# Patient Record
Sex: Female | Born: 1975 | Hispanic: No | Marital: Married | Smoking: Never smoker
Health system: Southern US, Community
[De-identification: ages and names within clinical notes are randomized; demographics above are authoritative.]

## PROBLEM LIST (undated history)

## (undated) DIAGNOSIS — G629 Polyneuropathy, unspecified: Secondary | ICD-10-CM

## (undated) HISTORY — DX: Polyneuropathy, unspecified: G62.9

---

## 2006-02-19 ENCOUNTER — Other Ambulatory Visit: Admission: RE | Admit: 2006-02-19 | Discharge: 2006-02-19 | Payer: Self-pay | Admitting: Obstetrics and Gynecology

## 2006-04-07 ENCOUNTER — Inpatient Hospital Stay (HOSPITAL_COMMUNITY): Admission: EM | Admit: 2006-04-07 | Discharge: 2006-04-08 | Payer: Self-pay | Admitting: Emergency Medicine

## 2014-01-12 ENCOUNTER — Encounter (HOSPITAL_COMMUNITY): Payer: Self-pay | Admitting: Emergency Medicine

## 2014-01-12 ENCOUNTER — Emergency Department (HOSPITAL_COMMUNITY)
Admission: EM | Admit: 2014-01-12 | Discharge: 2014-01-12 | Disposition: A | Discharge status: Home / Self Care | Payer: BC Managed Care – PPO | Attending: Emergency Medicine | Admitting: Emergency Medicine

## 2014-01-12 DIAGNOSIS — R52 Pain, unspecified: Secondary | ICD-10-CM | POA: Insufficient documentation

## 2014-01-12 DIAGNOSIS — M545 Low back pain, unspecified: Secondary | ICD-10-CM | POA: Insufficient documentation

## 2014-01-12 DIAGNOSIS — R55 Syncope and collapse: Secondary | ICD-10-CM | POA: Insufficient documentation

## 2014-01-12 DIAGNOSIS — Z3202 Encounter for pregnancy test, result negative: Secondary | ICD-10-CM | POA: Insufficient documentation

## 2014-01-12 LAB — I-STAT CHEM 8, ED
BUN: 12 mg/dL (ref 6–23)
CHLORIDE: 103 meq/L (ref 96–112)
Calcium, Ion: 1.14 mmol/L (ref 1.12–1.23)
Creatinine, Ser: 0.8 mg/dL (ref 0.50–1.10)
GLUCOSE: 91 mg/dL (ref 70–99)
HCT: 36 % (ref 36.0–46.0)
Hemoglobin: 12.2 g/dL (ref 12.0–15.0)
POTASSIUM: 4.3 meq/L (ref 3.7–5.3)
Sodium: 141 mEq/L (ref 137–147)
TCO2: 25 mmol/L (ref 0–100)

## 2014-01-12 LAB — POC URINE PREG, ED: Preg Test, Ur: NEGATIVE

## 2014-01-12 MED ORDER — CYCLOBENZAPRINE HCL 10 MG PO TABS
10.0000 mg | ORAL_TABLET | Freq: Once | ORAL | Status: AC
  Administered 2014-01-12: 10 mg via ORAL
  Filled 2014-01-12: qty 1

## 2014-01-12 MED ORDER — SODIUM CHLORIDE 0.9 % IV BOLUS (SEPSIS)
1000.0000 mL | Freq: Once | INTRAVENOUS | Status: AC
  Administered 2014-01-12: 1000 mL via INTRAVENOUS

## 2014-01-12 MED ORDER — KETOROLAC TROMETHAMINE 60 MG/2ML IM SOLN
60.0000 mg | Freq: Once | INTRAMUSCULAR | Status: AC
  Administered 2014-01-12: 60 mg via INTRAMUSCULAR
  Filled 2014-01-12: qty 2

## 2014-01-12 MED ORDER — CYCLOBENZAPRINE HCL 10 MG PO TABS: 10.0000 mg | ORAL_TABLET | Freq: Three times a day (TID) | ORAL | Status: DC | PRN

## 2014-01-12 MED ORDER — KETOROLAC TROMETHAMINE 30 MG/ML IJ SOLN: 30.0000 mg | Freq: Once | INTRAMUSCULAR | Status: DC

## 2014-01-12 MED ORDER — IBUPROFEN 600 MG PO TABS: 600.0000 mg | ORAL_TABLET | Freq: Four times a day (QID) | ORAL | Status: DC | PRN

## 2014-01-12 MED ORDER — HYDROMORPHONE HCL PF 1 MG/ML IJ SOLN
1.0000 mg | Freq: Once | INTRAMUSCULAR | Status: AC
  Administered 2014-01-12: 1 mg via INTRAVENOUS
  Filled 2014-01-12: qty 1

## 2014-01-12 MED ORDER — HYDROCODONE-ACETAMINOPHEN 5-325 MG PO TABS: 1.0000 | ORAL_TABLET | ORAL | Status: DC | PRN

## 2014-01-12 NOTE — ED Notes (Signed)
Pt A&O x4. No c/o pain when lying flat. Reports she cannot sit up without severe pain. VSS. No hx of back problems. On RA. Pt moving all extremities equally. Pt reports having nothing to eat or drink today. Has been caring for young daughter running high temperature. Denies any numbness or swelling. Will continue to monitor.

## 2014-01-12 NOTE — ED Notes (Addendum)
Per EMS- pt has been attending exercise classes for 10 days during the last month. Two weeks ago noticed a back spasm. Bent down today and had severe back spasm. 4 mg zofran and 500 cc NS given by EMS.One patent PIV present on arrival.

## 2014-01-12 NOTE — ED Provider Notes (Signed)
CSN: [id number]     Arrival date & time 01/12/14  50 History   First MD Initiated Contact with Patient 01/12/14 1637     Chief Complaint  Patient presents with  . Back Pain     (Consider location/radiation/quality/duration/timing/severity/associated sxs/prior Treatment) HPI 38 year old female presents with acute back spasm. She states that over the last month she's been doing new exercise classes has been coming home with back pain. The back pain is usually relieved with "a pain spray" her husband. She states that today she bent over and had a much more severe pain than normal. It feels like a bad spasm. Her husband put on some of the spray but it did not help. She has not tried anything else for the pain. The pain is currently severe. If she is laying still it is OK but if she moves at all it hurts. When the pain was really severe she passed out. She states it was due to the pain. Denies fever, dysuria, leg weakness, numbness or B/B incontinence.  History reviewed. No pertinent past medical history. Past Surgical History  Procedure Laterality Date  . Cesarean section     History reviewed. No pertinent family history. History  Substance Use Topics  . Smoking status: Never Smoker   . Smokeless tobacco: Never Used  . Alcohol Use: Yes     Comment: 1 glass of wine per week   OB History   Grav Para Term Preterm Abortions TAB SAB Ect Mult Living                 Review of Systems  Constitutional: Negative for fever.  Respiratory: Negative for shortness of breath.   Gastrointestinal: Negative for vomiting and abdominal pain.  Genitourinary: Negative for dysuria and menstrual problem.  Musculoskeletal: Positive for back pain.  Neurological: Positive for syncope. Negative for weakness and numbness.  All other systems reviewed and are negative.     Allergies  Review of patient's allergies indicates no known allergies.  Home Medications   Prior to Admission medications   Not on  File   BP 105/49  Pulse 72  Temp(Src) 98.5 F (36.9 C) (Oral)  Ht 5' 10"  (1.778 m)  Wt 183 lb (83.008 kg)  BMI 26.26 kg/m2  SpO2 100%  LMP 12/31/2013 Physical Exam  Nursing note and vitals reviewed. Constitutional: She is oriented to person, place, and time. She appears well-developed and well-nourished.  HENT:  Head: Normocephalic and atraumatic.  Right Ear: External ear normal.  Left Ear: External ear normal.  Nose: Nose normal.  Eyes: Right eye exhibits no discharge. Left eye exhibits no discharge.  Cardiovascular: Normal rate, regular rhythm and normal heart sounds.   Pulmonary/Chest: Effort normal and breath sounds normal.  Abdominal: Soft. She exhibits no distension. There is no tenderness.  Musculoskeletal:       Lumbar back: She exhibits decreased range of motion and tenderness. She exhibits no bony tenderness.       Back:  Neurological: She is alert and oriented to person, place, and time. She has normal strength. No sensory deficit. She displays no Babinski's sign on the right side. She displays no Babinski's sign on the left side.  Reflex Scores:      Patellar reflexes are 2+ on the right side and 2+ on the left side.      Achilles reflexes are 2+ on the right side and 2+ on the left side. Normal lower extremity strength bilaterally  Skin: Skin is warm and dry.  ED Course  Procedures (including critical care time) Labs Review Labs Reviewed  I-STAT CHEM 8, ED  POC URINE PREG, ED    Imaging Review No results found.   EKG Interpretation   Date/Time:  Tuesday January 12 2014 17:24:02 EDT Ventricular Rate:  63 PR Interval:  167 QRS Duration: 96 QT Interval:  450 QTC Calculation: 461 R Axis:   80 Text Interpretation:  Normal sinus rhythm Borderline T abnormalities,  anterior leads No old tracing to compare Confirmed by [place]  ([id number]) on 01/12/2014 6:05:29 PM      MDM   Final diagnoses:  Acute low back pain    Patient with acute low  back pain with bending. No bony tenderness or other injury to suggest needing imaging. Neurologically intact, no concern for acute spinal cord issue. Her pain was very severe, likely causing vaso-vagal syncope. EKG shows no acute cardiac reason for syncope. Pain improved with treatment in ED. Able to ambulate. Will treat with pain control and discharge in stable condition.    Ephraim Hamburger, MD 01/13/14 [phone]

## 2014-01-12 NOTE — ED Notes (Signed)
Pt able to stand. Still "feeling groggy". Toradol not given per patient wishes. AVS given, patient aware prescription for Flexeril available.

## 2014-01-12 NOTE — ED Notes (Signed)
Pt is currently attempting to use a bed pan to provide a urine sample.

## 2014-01-12 NOTE — ED Notes (Signed)
Husband here to provide safe transport home, pt unable to stand and bear weight.  Pt reports "this is as bad as before".  Dr. Regenia Skeeter made aware.  Awaiting orders.

## 2014-01-12 NOTE — ED Notes (Signed)
Pt reports pain improved at this time, pt able to stand and bear weight.  Pt assisted to exit via w/c, able to walk few steps to car.  Pt's husband here to provide safe transport home.

## 2014-01-12 NOTE — ED Notes (Signed)
Bed: WA03 Expected date:  Expected time:  Means of arrival:  Comments: EMS- back pain, hypotension

## 2014-01-12 NOTE — ED Notes (Signed)
Jess Barters (husband): [phone] (cell)

## 2014-09-01 ENCOUNTER — Other Ambulatory Visit: Payer: Self-pay

## 2014-09-01 ENCOUNTER — Other Ambulatory Visit (HOSPITAL_COMMUNITY)
Admission: RE | Admit: 2014-09-01 | Discharge: 2014-09-01 | Disposition: A | Discharge status: Home / Self Care | Payer: BC Managed Care – PPO | Source: Ambulatory Visit | Attending: Family Medicine | Admitting: Family Medicine

## 2014-09-01 DIAGNOSIS — Z01419 Encounter for gynecological examination (general) (routine) without abnormal findings: Secondary | ICD-10-CM | POA: Diagnosis present

## 2014-09-06 ENCOUNTER — Other Ambulatory Visit: Payer: Self-pay | Admitting: Family

## 2014-09-06 DIAGNOSIS — R1032 Left lower quadrant pain: Secondary | ICD-10-CM

## 2014-09-06 LAB — CYTOLOGY - PAP

## 2014-09-08 ENCOUNTER — Ambulatory Visit
Admission: RE | Admit: 2014-09-08 | Discharge: 2014-09-08 | Disposition: A | Discharge status: Home / Self Care | Payer: BC Managed Care – PPO | Source: Ambulatory Visit | Attending: Family | Admitting: Family

## 2014-09-08 DIAGNOSIS — R1032 Left lower quadrant pain: Secondary | ICD-10-CM

## 2014-09-08 IMAGING — US US PELVIS COMPLETE
1 series · 14 of 25 positions shown · non-contrast
Comparison: None

CLINICAL DATA: Left lower quadrant pain x2 months during menses



[Series 1: us pelvis complete · 0.31mm/px · 14 of 65 slices shown]
[im 1/65]
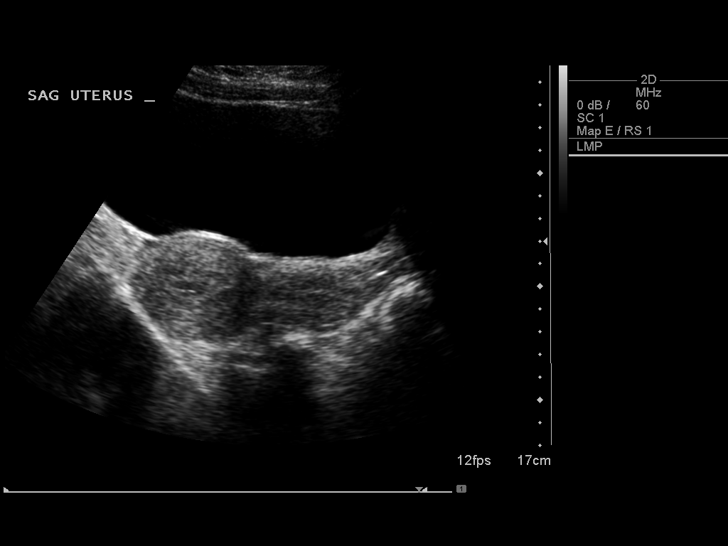
[im 6/65]
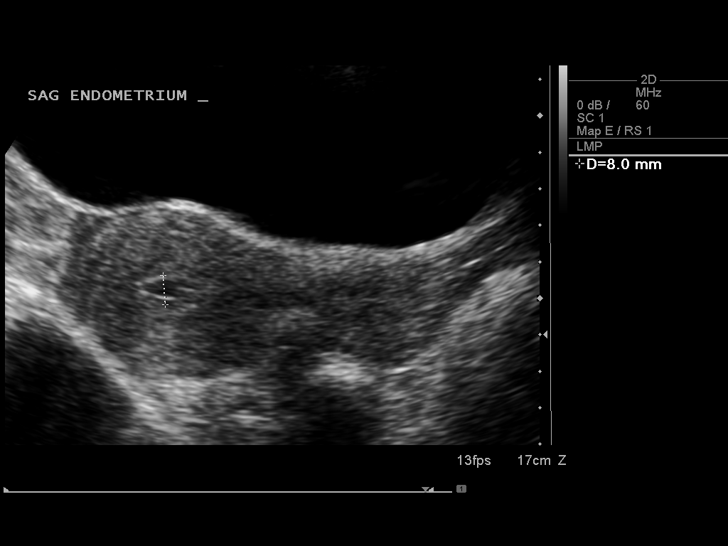
[im 11/65]
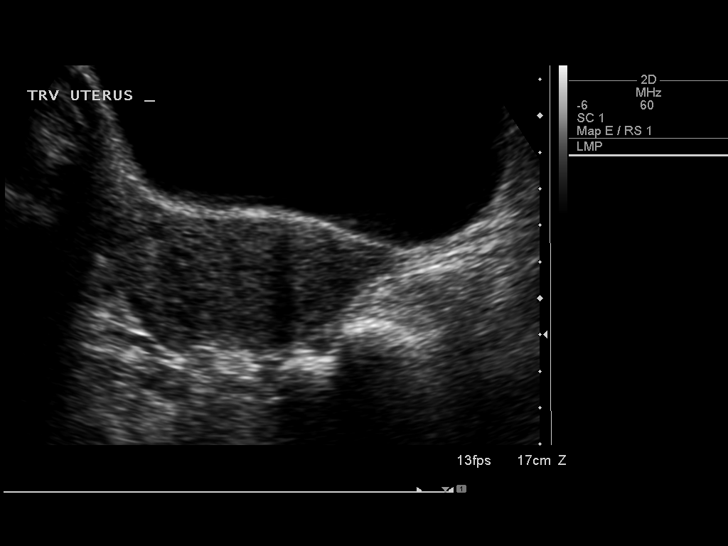
[im 17/65]
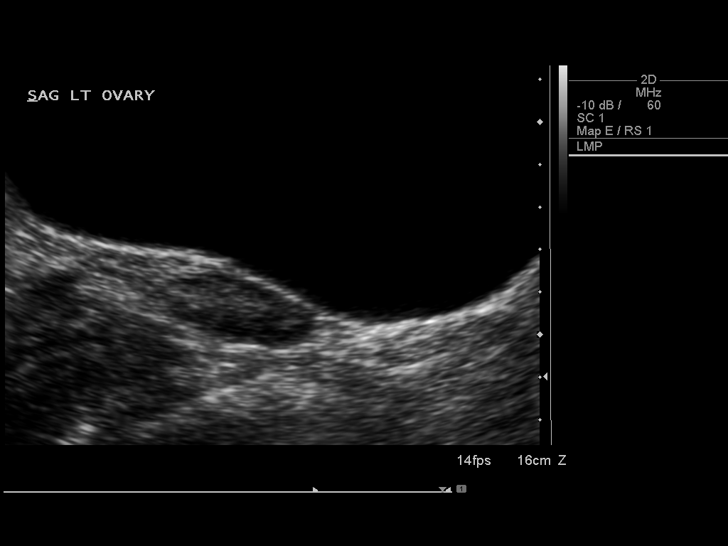
[im 22/65]
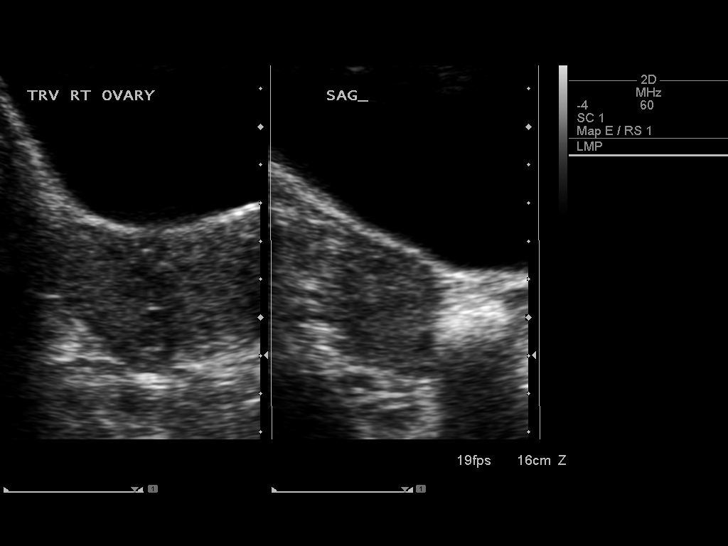
[im 25/65]
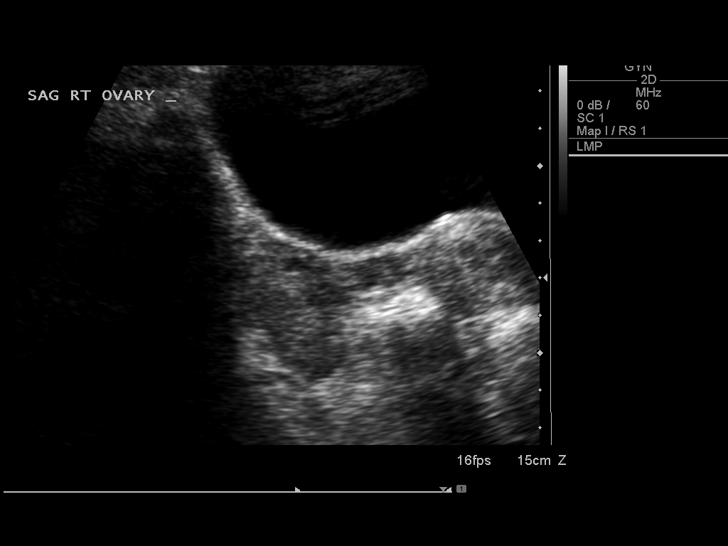
[im 30/65]
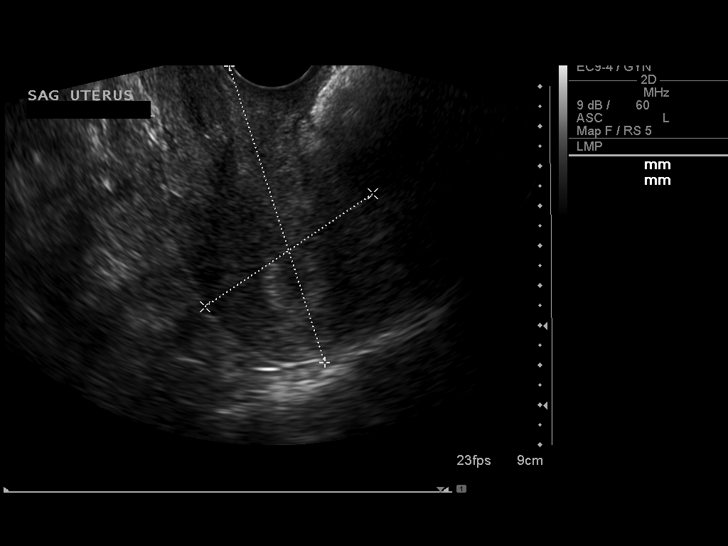
[im 35/65]
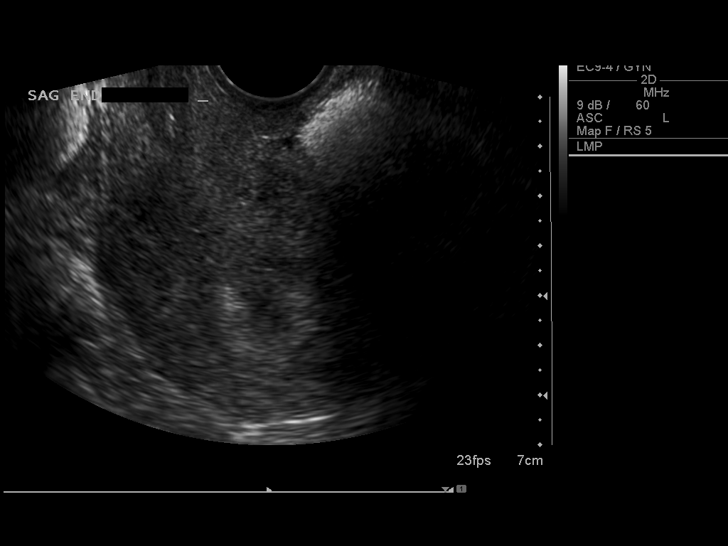
[im 41/65]
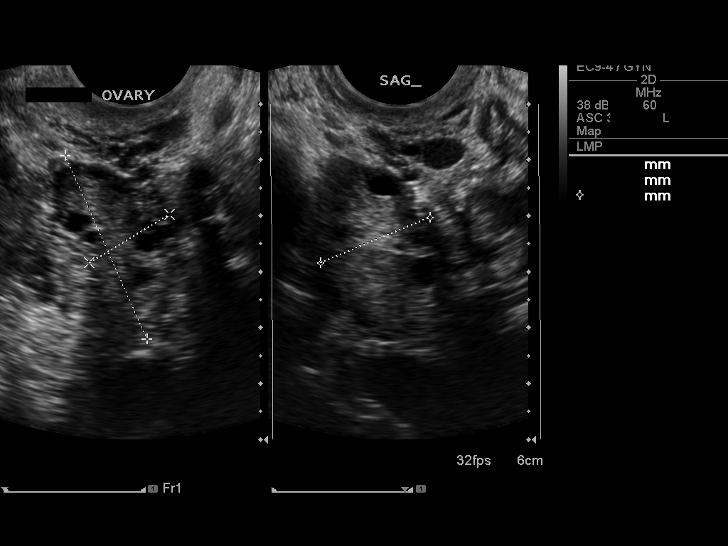
[im 43/65]
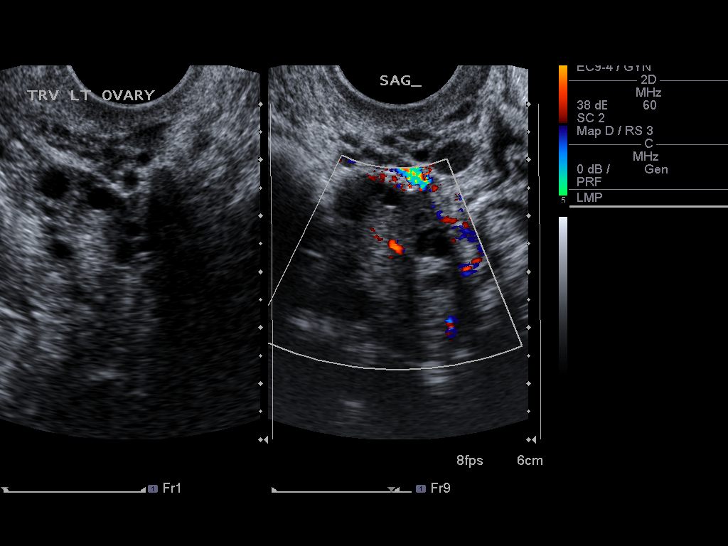
[im 49/65]
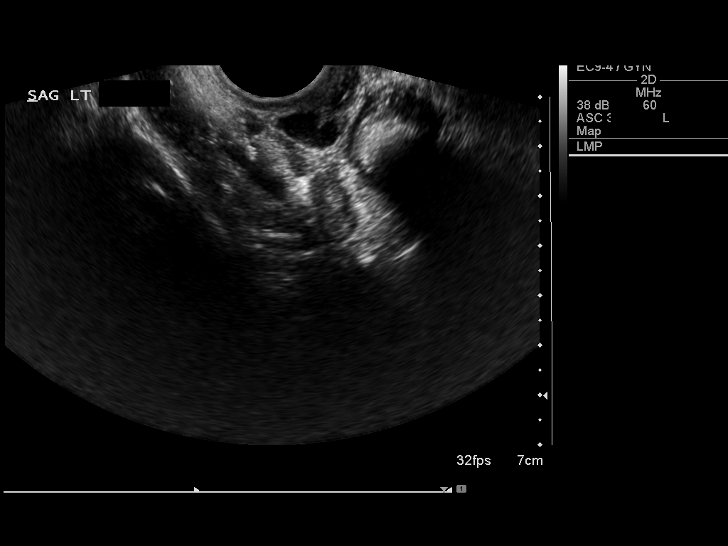
[im 54/65]
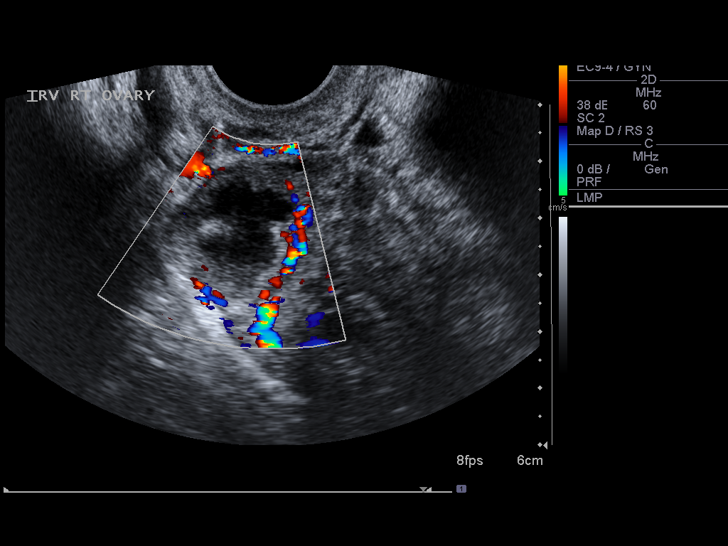
[im 59/65]
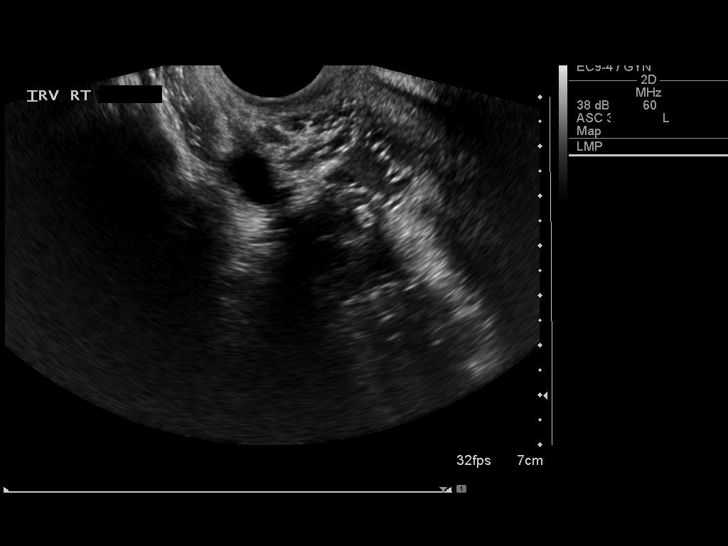
[im 65/65]
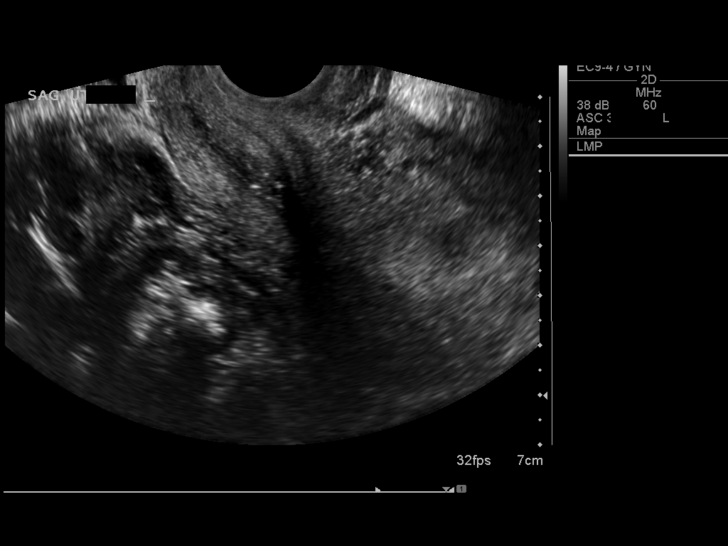

[14 of 25 positions shown; findings below may reference images not displayed]

FINDINGS: Uterus

Measurements: 7.8 x 5.1 x 6.1 cm. Retroflexed. No fibroids or other
mass visualized.

Endometrium

Thickness: 18 mm.  No focal abnormality visualized.

Right ovary

Measurements: 4.2 x 3.1 x 3.0 cm. 1.7 x 2.1 x 1.5 cm complex/
hemorrhagic involuting corpus luteal cyst.

Left ovary

Measurements: 3.6 x 1.7 x 2.1 cm. Normal appearance/no adnexal mass.

Other findings

No free fluid.
IMPRESSION: 2.1 cm complex/hemorrhagic involuting right corpus luteal cyst,
physiologic.

Otherwise negative pelvic ultrasound.

## 2015-07-14 ENCOUNTER — Other Ambulatory Visit: Payer: Self-pay | Admitting: Family Medicine

## 2015-07-14 DIAGNOSIS — R1032 Left lower quadrant pain: Secondary | ICD-10-CM

## 2015-07-14 DIAGNOSIS — Z8742 Personal history of other diseases of the female genital tract: Secondary | ICD-10-CM

## 2015-07-25 ENCOUNTER — Ambulatory Visit
Admission: RE | Admit: 2015-07-25 | Discharge: 2015-07-25 | Disposition: A | Discharge status: Home / Self Care | Payer: BLUE CROSS/BLUE SHIELD | Source: Ambulatory Visit | Attending: Family Medicine | Admitting: Family Medicine

## 2015-07-25 DIAGNOSIS — Z8742 Personal history of other diseases of the female genital tract: Secondary | ICD-10-CM

## 2015-07-25 DIAGNOSIS — R1032 Left lower quadrant pain: Secondary | ICD-10-CM

## 2015-12-07 ENCOUNTER — Encounter (HOSPITAL_COMMUNITY): Payer: Self-pay | Admitting: Family Medicine

## 2015-12-07 ENCOUNTER — Emergency Department (HOSPITAL_BASED_OUTPATIENT_CLINIC_OR_DEPARTMENT_OTHER)
Admit: 2015-12-07 | Discharge: 2015-12-07 | Disposition: A | Discharge status: Home / Self Care | Payer: BLUE CROSS/BLUE SHIELD

## 2015-12-07 ENCOUNTER — Emergency Department (HOSPITAL_COMMUNITY)
Admission: EM | Admit: 2015-12-07 | Discharge: 2015-12-07 | Disposition: A | Discharge status: Home / Self Care | Payer: BLUE CROSS/BLUE SHIELD | Attending: Emergency Medicine | Admitting: Emergency Medicine

## 2015-12-07 DIAGNOSIS — M7989 Other specified soft tissue disorders: Secondary | ICD-10-CM

## 2015-12-07 DIAGNOSIS — Z3202 Encounter for pregnancy test, result negative: Secondary | ICD-10-CM | POA: Insufficient documentation

## 2015-12-07 DIAGNOSIS — N390 Urinary tract infection, site not specified: Secondary | ICD-10-CM | POA: Diagnosis not present

## 2015-12-07 DIAGNOSIS — R202 Paresthesia of skin: Secondary | ICD-10-CM | POA: Insufficient documentation

## 2015-12-07 DIAGNOSIS — R200 Anesthesia of skin: Secondary | ICD-10-CM | POA: Diagnosis present

## 2015-12-07 LAB — I-STAT CHEM 8, ED
BUN: 10 mg/dL (ref 6–20)
CALCIUM ION: 1.2 mmol/L (ref 1.12–1.23)
CHLORIDE: 101 mmol/L (ref 101–111)
Creatinine, Ser: 0.7 mg/dL (ref 0.44–1.00)
GLUCOSE: 117 mg/dL — AB (ref 65–99)
HCT: 40 % (ref 36.0–46.0)
Hemoglobin: 13.6 g/dL (ref 12.0–15.0)
Potassium: 4.2 mmol/L (ref 3.5–5.1)
Sodium: 141 mmol/L (ref 135–145)
TCO2: 26 mmol/L (ref 0–100)

## 2015-12-07 LAB — DIFFERENTIAL
Basophils Absolute: 0 10*3/uL (ref 0.0–0.1)
Basophils Relative: 0 %
EOS PCT: 1 %
Eosinophils Absolute: 0.1 10*3/uL (ref 0.0–0.7)
LYMPHS ABS: 2.4 10*3/uL (ref 0.7–4.0)
LYMPHS PCT: 26 %
MONO ABS: 0.3 10*3/uL (ref 0.1–1.0)
Monocytes Relative: 4 %
NEUTROS ABS: 6.4 10*3/uL (ref 1.7–7.7)
Neutrophils Relative %: 69 %

## 2015-12-07 LAB — COMPREHENSIVE METABOLIC PANEL
ALBUMIN: 3.6 g/dL (ref 3.5–5.0)
ALK PHOS: 37 U/L — AB (ref 38–126)
ALT: 14 U/L (ref 14–54)
ANION GAP: 7 (ref 5–15)
AST: 18 U/L (ref 15–41)
BILIRUBIN TOTAL: 0.3 mg/dL (ref 0.3–1.2)
BUN: 7 mg/dL (ref 6–20)
CALCIUM: 9.4 mg/dL (ref 8.9–10.3)
CO2: 25 mmol/L (ref 22–32)
CREATININE: 0.72 mg/dL (ref 0.44–1.00)
Chloride: 106 mmol/L (ref 101–111)
GFR calc non Af Amer: >60 mL/min (ref >60)
GLUCOSE: 122 mg/dL — AB (ref 65–99)
Potassium: 4.2 mmol/L (ref 3.5–5.1)
SODIUM: 138 mmol/L (ref 135–145)
TOTAL PROTEIN: 6.8 g/dL (ref 6.5–8.1)

## 2015-12-07 LAB — CBC
HCT: 37.5 % (ref 36.0–46.0)
HEMOGLOBIN: 12.4 g/dL (ref 12.0–15.0)
MCH: 28 pg (ref 26.0–34.0)
MCHC: 33.1 g/dL (ref 30.0–36.0)
MCV: 84.7 fL (ref 78.0–100.0)
PLATELETS: 319 10*3/uL (ref 150–400)
RBC: 4.43 MIL/uL (ref 3.87–5.11)
RDW: 13.3 % (ref 11.5–15.5)
WBC: 9.2 10*3/uL (ref 4.0–10.5)

## 2015-12-07 LAB — POC URINE PREG, ED: Preg Test, Ur: NEGATIVE

## 2015-12-07 LAB — URINALYSIS, ROUTINE W REFLEX MICROSCOPIC
BILIRUBIN URINE: NEGATIVE
GLUCOSE, UA: NEGATIVE mg/dL
KETONES UR: NEGATIVE mg/dL
Nitrite: NEGATIVE
PH: 6 (ref 5.0–8.0)
Protein, ur: NEGATIVE mg/dL
SPECIFIC GRAVITY, URINE: 1.013 (ref 1.005–1.030)

## 2015-12-07 LAB — I-STAT TROPONIN, ED: Troponin i, poc: 0 ng/mL (ref 0.00–0.08)

## 2015-12-07 LAB — PROTIME-INR
INR: 1.06 (ref 0.00–1.49)
PROTHROMBIN TIME: 14 s (ref 11.6–15.2)

## 2015-12-07 LAB — URINE MICROSCOPIC-ADD ON

## 2015-12-07 LAB — APTT: aPTT: 31 seconds (ref 24–37)

## 2015-12-07 NOTE — Discharge Instructions (Signed)
Paresthesia Paresthesia is an abnormal burning or prickling sensation. This sensation is generally felt in the hands, arms, legs, or feet. However, it may occur in any part of the body. Usually, it is not painful. The feeling may be described as:  Tingling or numbness.  Pins and needles.  Skin crawling.  Buzzing.  Limbs falling asleep.  Itching. Most people experience temporary (transient) paresthesia at some time in their lives. Paresthesia may occur when you breathe too quickly (hyperventilation). It can also occur without any apparent cause. Commonly, paresthesia occurs when pressure is placed on a nerve. The sensation quickly goes away after the pressure is removed. For some people, however, paresthesia is a long-lasting (chronic) condition that is caused by an underlying disorder. If you continue to have paresthesia, you may need further medical evaluation. HOME CARE INSTRUCTIONS Watch your condition for any changes. Taking the following actions may help to lessen any discomfort that you are feeling:  Avoid drinking alcohol.  Try acupuncture or massage to help relieve your symptoms.  Keep all follow-up visits as directed by your health care provider. This is important. SEEK MEDICAL CARE IF:  You continue to have episodes of paresthesia.  Your burning or prickling feeling gets worse when you walk.  You have pain, cramps, or dizziness.  You develop a rash. SEEK IMMEDIATE MEDICAL CARE IF:  You feel weak.  You have trouble walking or moving.  You have problems with speech, understanding, or vision.  You feel confused.  You cannot control your bladder or bowel movements.  You have numbness after an injury.  You faint.   This information is not intended to replace advice given to you by your health care provider. Make sure you discuss any questions you have with your health care provider.   Document Released: 09/07/2002 Document Revised: 02/01/2015 Document Reviewed:  09/13/2014 Elsevier Interactive Patient Education 2016 Else[employer]  Peripheral Neuropathy Peripheral neuropathy is a type of nerve damage. It affects nerves that carry signals between the spinal cord and other parts of the body. These are called peripheral nerves. With peripheral neuropathy, one nerve or a group of nerves may be damaged.  CAUSES  Many things can damage peripheral nerves. For some people with peripheral neuropathy, the cause is unknown. Some causes include:  Diabetes. This is the most common cause of peripheral neuropathy.  Injury to a nerve.  Pressure or stress on a nerve that lasts a long time.  Too little vitamin B. Alcoholism can lead to this.  Infections.  Autoimmune diseases, such as multiple sclerosis and systemic lupus erythematosus.  Inherited nerve diseases.  Some medicines, such as cancer drugs.  Toxic substances, such as lead and mercury.  Too little blood flowing to the legs.  Kidney disease.  Thyroid disease. SIGNS AND SYMPTOMS  Different people have different symptoms. The symptoms you have will depend on which of your nerves is damaged. Common symptoms include:  Loss of feeling (numbness) in the feet and hands.  Tingling in the feet and hands.  Pain that burns.  Very sensitive skin.  Weakness.  Not being able to move a part of the body (paralysis).  Muscle twitching.  Clumsiness or poor coordination.  Loss of balance.  Not being able to control your bladder.  Feeling dizzy.  Sexual problems. DIAGNOSIS  Peripheral neuropathy is a symptom, not a disease. Finding the cause of peripheral neuropathy can be hard. To figure that out, your health care provider will take a medical history and do a  physical exam. A neurological exam will also be done. This involves checking things affected by your brain, spinal cord, and nerves (nervous system). For example, your health care provider will check your reflexes, how you move, and what  you can feel.  Other types of tests may also be ordered, such as:  Blood tests.  A test of the fluid in your spinal cord.  Imaging tests, such as CT scans or an MRI.  Electromyography (EMG). This test checks the nerves that control muscles.  Nerve conduction velocity tests. These tests check how fast messages pass through your nerves.  Nerve biopsy. A small piece of nerve is removed. It is then checked under a microscope. TREATMENT   Medicine is often used to treat peripheral neuropathy. Medicines may include:  Pain-relieving medicines. Prescription or over-the-counter medicine may be suggested.  Antiseizure medicine. This may be used for pain.  Antidepressants. These also may help ease pain from neuropathy.  Lidocaine. This is a numbing medicine. You might wear a patch or be given a shot.  Mexiletine. This medicine is typically used to help control irregular heart rhythms.  Surgery. Surgery may be needed to relieve pressure on a nerve or to destroy a nerve that is causing pain.  Physical therapy to help movement.  Assistive devices to help movement. HOME CARE INSTRUCTIONS   Only take over-the-counter or prescription medicines as directed by your health care provider. Follow the instructions carefully for any given medicines. Do not take any other medicines without first getting approval from your health care provider.  If you have diabetes, work closely with your health care provider to keep your blood sugar under control.  If you have numbness in your feet:  Check every day for signs of injury or infection. Watch for redness, warmth, and swelling.  Wear padded socks and comfortable shoes. These help protect your feet.  Do not do things that put pressure on your damaged nerve.  Do not smoke. Smoking keeps blood from getting to damaged nerves.  Avoid or limit alcohol. Too much alcohol can cause a lack of B vitamins. These vitamins are needed for healthy  nerves.  Develop a good support system. Coping with peripheral neuropathy can be stressful. Talk to a mental health specialist or join a support group if you are struggling.  Follow up with your health care provider as directed. SEEK MEDICAL CARE IF:   You have new signs or symptoms of peripheral neuropathy.  You are struggling emotionally from dealing with peripheral neuropathy.  You have a fever. SEEK IMMEDIATE MEDICAL CARE IF:   You have an injury or infection that is not healing.  You feel very dizzy or begin vomiting.  You have chest pain.  You have trouble breathing.   This information is not intended to replace advice given to you by your health care provider. Make sure you discuss any questions you have with your health care provider.   Document Released: 09/07/2002 Document Revised: 05/30/2011 Document Reviewed: 05/25/2013 Elsevier Interactive Patient Education [employer].

## 2015-12-07 NOTE — Progress Notes (Signed)
*  Preliminary Results* Bilateral lower extremity venous duplex completed. Bilateral lower extremities are negative for deep vein thrombosis. There is no evidence of Baker's cyst bilaterally.  12/07/2015  Maudry Mayhew, RVT, RDCS, RDMS

## 2015-12-07 NOTE — ED Notes (Signed)
Contacted vascular tech/on the way!

## 2015-12-07 NOTE — ED Provider Notes (Signed)
CSN: [id number]     Arrival date & time 12/07/15  1625 History   First MD Initiated Contact with Patient 12/07/15 1714     Chief Complaint  Patient presents with  . Numbness     (Consider location/radiation/quality/duration/timing/severity/associated sxs/prior Treatment) Patient is a 40 y.o. female presenting with general illness. The history is provided by the patient.  Illness Location:  Bilateral lower extremities Quality:  Feeling of "swelling" Severity:  Moderate Onset quality:  Gradual Duration:  3 days Timing:  Constant Progression:  Worsening Chronicity:  New Context:  First time occurrence. The patient does have a history of chronic back pain who was seen by a chiropractor with back manipulations. Relieved by:  Nothing Worsened by:  Nothing Ineffective treatments:  Nothing Associated symptoms: no abdominal pain, no chest pain, no congestion, no cough, no diarrhea, no fatigue, no fever, no loss of consciousness, no myalgias, no nausea, no rash, no rhinorrhea, no shortness of breath, no vomiting and no wheezing    Patient denied likely heavy metal poisoning (no moonshine consumption). She did report that they had been drinking new type of water for the past 3 weeks but no one in her family had similar symptoms. No recent suspicious fish consumption. No trauma or acute exacerbation of her back pain. Patient also denies any recent fevers illnesses or infections.   Of note the patient was seen by her PCP who recommended she present to the ED for further evaluation of possible Guillain-Barr. Additionally patient was noted to have a UTI at her PCP but was not given antibiotics in case she would be admitted here.   History reviewed. No pertinent past medical history. Past Surgical History  Procedure Laterality Date  . Cesarean section     History reviewed. No pertinent family history. Social History  Substance Use Topics  . Smoking status: Never Smoker   . Smokeless tobacco:  Never Used  . Alcohol Use: Yes     Comment: 1 glass of wine per week   OB History    No data available     Review of Systems  Constitutional: Negative for fever and fatigue.  HENT: Negative for congestion and rhinorrhea.   Respiratory: Negative for cough, shortness of breath and wheezing.   Cardiovascular: Negative for chest pain.  Gastrointestinal: Negative for nausea, vomiting, abdominal pain and diarrhea.  Musculoskeletal: Negative for myalgias.  Skin: Negative for rash.  Neurological: Negative for dizziness, loss of consciousness, syncope, facial asymmetry, weakness and light-headedness.       No urinary or bowel incontinence      Allergies  Review of patient's allergies indicates no known allergies.  Home Medications   Prior to Admission medications   Medication Sig Start Date End Date Taking? Authorizing Provider  cyclobenzaprine (FLEXERIL) 10 MG tablet Take 1 tablet (10 mg total) by mouth 3 (three) times daily as needed for muscle spasms. 01/12/14   Sherwood Gambler, MD  HYDROcodone-acetaminophen (NORCO) 5-325 MG per tablet Take 1-2 tablets by mouth every 4 (four) hours as needed. 01/12/14   Sherwood Gambler, MD  ibuprofen (ADVIL,MOTRIN) 600 MG tablet Take 1 tablet (600 mg total) by mouth every 6 (six) hours as needed for mild pain or moderate pain. 01/12/14   Sherwood Gambler, MD   BP 114/72 mmHg  Pulse 77  Temp(Src) 98.1 F (36.7 C) (Oral)  Resp 16  SpO2 96%  LMP 12/02/2015 Physical Exam  Constitutional: She is oriented to person, place, and time. She appears well-developed and well-nourished. No distress.  HENT:  Head: Normocephalic and atraumatic.  Right Ear: External ear normal.  Left Ear: External ear normal.  Nose: Nose normal.  Eyes: Conjunctivae and EOM are normal. Pupils are equal, round, and reactive to light. Right eye exhibits no discharge. Left eye exhibits no discharge. No scleral icterus.  Neck: Normal range of motion. Neck supple.  Cardiovascular:  Normal rate, regular rhythm and normal heart sounds.  Exam reveals no gallop and no friction rub.   No murmur heard. Pulmonary/Chest: Effort normal and breath sounds normal. No stridor. No respiratory distress. She has no wheezes.  Abdominal: Soft. She exhibits no distension. There is no tenderness.  Musculoskeletal: She exhibits no edema or tenderness.  Neurological: She is alert and oriented to person, place, and time.  Cranial Nerves  II Visual Fields: Intact to confrontation. Visual fields intact. III, IV, VI: Pupils equal and reactive to light and near. Full eye movement without nystagmus  V Facial Sensation: Normal. No weakness of masticatory muscles  VII: No facial weakness or asymmetry  VIII Auditory Acuity: Grossly normal  IX/X: The uvula is midline; the palate elevates symmetrically  XI: Normal sternocleidomastoid and trapezius strength  XII: The tongue is midline. No atrophy or fasciculations.   Motor System: Muscle Strength: 5/5 and symmetric in the upper and lower extremities. No pronation or drift.  Muscle Tone: Tone and muscle bulk are normal in the upper and lower extremities.   Reflexes: DTRs: 2+ and symmetrical in all four extremities. Plantar responses are flexor bilaterally.  Coordination: Intact finger-to-nose, heel-to-shin, and rapid alternating movements. No tremor.  Sensation: Intact to light touch, vibration, and pinprick. Normal saddle sensation. Negative Romberg test.  Gait: Routine and tandem gait are normal   Skin: Skin is warm and dry. No rash noted. She is not diaphoretic. No erythema.  Psychiatric: She has a normal mood and affect.    ED Course  Procedures (including critical care time) Labs Review Labs Reviewed  COMPREHENSIVE METABOLIC PANEL - Abnormal; Notable for the following:    Glucose, Bld 122 (*)    Alkaline Phosphatase 37 (*)    All other components within normal limits  URINALYSIS, ROUTINE W REFLEX MICROSCOPIC (NOT AT [facility]) - Abnormal;  Notable for the following:    APPearance CLOUDY (*)    Hgb urine dipstick MODERATE (*)    Leukocytes, UA MODERATE (*)    All other components within normal limits  URINE MICROSCOPIC-ADD ON - Abnormal; Notable for the following:    Squamous Epithelial / LPF 0-5 (*)    Bacteria, UA FEW (*)    All other components within normal limits  I-STAT CHEM 8, ED - Abnormal; Notable for the following:    Glucose, Bld 117 (*)    All other components within normal limits  URINE CULTURE  PROTIME-INR  APTT  CBC  DIFFERENTIAL  I-STAT TROPOININ, ED  POC URINE PREG, ED  CBG MONITORING, ED    Imaging Review No results found. I have personally reviewed and evaluated these images and lab results as part of my medical decision-making.   EKG Interpretation None      MDM   1. Urinary tract infection. We'll repeat a UA to confirm the infection and treat if needed. Urinalysis indeterminate for UTI. Culture sent. At this time will hold off on treatment until cultures result.  2. Paresthesias.  Differential: electrolyte derangements, vitamin deficiency, or DVT. Unlikely heavy metal poisoning, food toxin, Guillain-Barr, transverse myelitis, MS, CVA, or cauda equina. We'll also rule out pregnancy.  UPT negative. Labs without electrolyte arrangements. CBC unremarkable (No evidence of macrocytic anemia suggestive and vitamin B12 deficiency). Ultrasound negative for DVTs.  She is safe for discharge with strict return precautions. Patient is to follow-up with her PCP in next 1-2 days for continued workup and management.  Diagnostic studies interpreted by me and use to my clinical decision-making.   Patient seen in conjunction with Dr. Sabra Heck.   Final diagnoses:  Paresthesia        Addison Lank, MD 12/07/15 1939  Noemi Chapel, MD 12/09/15 2136

## 2015-12-07 NOTE — ED Notes (Signed)
Pt here for numbness in bilateral extremities that started Saturday and now is ascending into pelvis area. sts some leg and knee pain.

## 2015-12-09 LAB — URINE CULTURE

## 2015-12-20 ENCOUNTER — Other Ambulatory Visit (HOSPITAL_COMMUNITY): Payer: Self-pay | Admitting: Family Medicine

## 2015-12-20 DIAGNOSIS — R7989 Other specified abnormal findings of blood chemistry: Secondary | ICD-10-CM

## 2016-01-03 ENCOUNTER — Ambulatory Visit (HOSPITAL_COMMUNITY)
Admission: RE | Admit: 2016-01-03 | Discharge: 2016-01-03 | Disposition: A | Discharge status: Home / Self Care | Payer: BLUE CROSS/BLUE SHIELD | Source: Ambulatory Visit | Attending: Family Medicine | Admitting: Family Medicine

## 2016-01-03 ENCOUNTER — Encounter (HOSPITAL_COMMUNITY): Payer: BLUE CROSS/BLUE SHIELD

## 2016-01-03 DIAGNOSIS — R946 Abnormal results of thyroid function studies: Secondary | ICD-10-CM | POA: Insufficient documentation

## 2016-01-03 DIAGNOSIS — R7989 Other specified abnormal findings of blood chemistry: Secondary | ICD-10-CM

## 2016-01-04 ENCOUNTER — Encounter (HOSPITAL_COMMUNITY)
Admission: RE | Admit: 2016-01-04 | Discharge: 2016-01-04 | Disposition: A | Discharge status: Home / Self Care | Payer: BLUE CROSS/BLUE SHIELD | Source: Ambulatory Visit | Attending: Family Medicine | Admitting: Family Medicine

## 2016-01-04 ENCOUNTER — Encounter (HOSPITAL_COMMUNITY): Payer: BLUE CROSS/BLUE SHIELD

## 2016-01-04 DIAGNOSIS — R946 Abnormal results of thyroid function studies: Secondary | ICD-10-CM | POA: Diagnosis not present

## 2016-01-04 MED ORDER — SODIUM PERTECHNETATE TC 99M INJECTION: 10.0000 | Freq: Once | INTRAVENOUS | Status: DC | PRN

## 2016-01-04 MED ORDER — SODIUM IODIDE I 131 CAPSULE: 0.0080 | Freq: Once | INTRAVENOUS | Status: DC | PRN

## 2016-01-17 ENCOUNTER — Encounter (HOSPITAL_COMMUNITY): Payer: BLUE CROSS/BLUE SHIELD

## 2016-01-18 ENCOUNTER — Other Ambulatory Visit (HOSPITAL_COMMUNITY): Payer: BLUE CROSS/BLUE SHIELD

## 2016-01-23 ENCOUNTER — Encounter: Payer: Self-pay | Admitting: Neurology

## 2016-01-23 ENCOUNTER — Ambulatory Visit (INDEPENDENT_AMBULATORY_CARE_PROVIDER_SITE_OTHER): Payer: BLUE CROSS/BLUE SHIELD | Admitting: Neurology

## 2016-01-23 VITALS — BP 116/76 | HR 79 | Ht 70.0 in | Wt 181.0 lb

## 2016-01-23 DIAGNOSIS — M2550 Pain in unspecified joint: Secondary | ICD-10-CM | POA: Diagnosis not present

## 2016-01-23 DIAGNOSIS — R202 Paresthesia of skin: Secondary | ICD-10-CM | POA: Diagnosis not present

## 2016-01-23 MED ORDER — ALPRAZOLAM 0.5 MG PO TABS: 0.5000 mg | ORAL_TABLET | Freq: Every evening | ORAL | Status: DC | PRN

## 2016-01-23 MED ORDER — DULOXETINE HCL 30 MG PO CPEP: 30.0000 mg | ORAL_CAPSULE | Freq: Every day | ORAL | Status: AC

## 2016-01-23 NOTE — Progress Notes (Signed)
PATIENT: Holly Serrano DOB: Apr 24, 1976  Chief Complaint  Patient presents with  . Numbness    She started experiencing numbness in her toes in January then shortly after in her bilateral legs.  Now she is reporting numbness and pain in various areas of her body.  She has also developed exteme fatigue.  Intermittently, she wakes up with chest tightness and has to take deep breaths to work through the episodes.     HISTORICAL  Holly Serrano is a 41 years old right-handed female, seen in refer by her primary care physician Dr. Aura Dials for evaluation of paresthesia In January 23 2016  I reviewed and summarized referring note, she had a history of anemia, is taking iron supplement,  She used to be very active, since January 2017, she began to have constellation of symptoms, feel fatigue, Shortness of breath with prolonged walking, her toes tends to change into blue color, she also complains of bilateral foot numbness, intermittent leg pain, numbness, foggy sensation, difficulty focusing, also has variable degree of body achy pain,  Symptoms getting worse since April 2017, she felt bloated in her abdomen, chest pressure, tightness sensation, intermittent neck, hip, joint pain, frequent headaches starting at occipital region, spreading forward, retro-orbital area pressure headaches, like to close her eyes. She also complains of constellation of complaints, including decreased energy, extreme fatigue, slurred speech, confusion,   I reviewed laboratory evaluation in December 13 2015 showed normal CBC, with hemoglobin of 12.9, normal CMP, B12 was 1 980, TSH was elevated 5.98, normal free T4, heavy metal profile was negative  Repeat laboratory showed elevated thyroglobulin antibody, 87.4, normal being less than 0.9, elevated TSH, 5.98, normal free T4, free T3, she will have evaluation by her endocrinologist soon.   Methylmalonic acid level was 200, heavy metal profile was negative for lead,  arsenic, mercury,  Thyroid uptake January 04 2016 showed normal 24-hour radioactive thyroid uptake and scan  REVIEW OF SYSTEMS: Full 14 system review of systems performed and notable only for weight gain, fatigue, eye pain, shortness of breath, constipation, blood in urine, increased thirst, achy muscles, confusion, headaches, numbness, weakness, slurred speech, dizziness, insomnia, restless leg, anxiety, decreased energy.  ALLERGIES: No Known Allergies  HOME MEDICATIONS: Current Outpatient Prescriptions  Medication Sig Dispense Refill  . IRON PO Take by mouth daily.    Marland Kitchen MICROGESTIN 1-20 MG-MCG tablet   0   No current facility-administered medications for this visit.    PAST MEDICAL HISTORY: Past Medical History  Diagnosis Date  . Peripheral neuropathy ([place])     PAST SURGICAL HISTORY: Past Surgical History  Procedure Laterality Date  . Cesarean section      x 2    FAMILY HISTORY: Family History  Problem Relation Age of Onset  . Brain cancer Father   . Healthy Mother     SOCIAL HISTORY:  Social History   Social History  . Marital Status: Married    Spouse Name: N/A  . Number of Children: 2  . Years of Education: Masters   Occupational History  . Housewife    Social History Main Topics  . Smoking status: Never Smoker   . Smokeless tobacco: Never Used  . Alcohol Use: No  . Drug Use: No  . Sexual Activity: Yes   Other Topics Concern  . Not on file   Social History Narrative   Lives at home with husband and two children.   Right-handed.   2 cups tea per day.  PHYSICAL EXAM   Filed Vitals:   01/23/16 1330  BP: 116/76  Pulse: 79  Height: 5' 10"  (1.778 m)  Weight: 181 lb (82.101 kg)    Not recorded      Body mass index is 25.97 kg/(m^2).  PHYSICAL EXAMNIATION:  Gen: NAD, conversant, well nourised, obese, well groomed                     Cardiovascular: Regular rate rhythm, no peripheral edema, warm, nontender. Eyes: Conjunctivae clear  without exudates or hemorrhage Neck: Supple, no carotid bruise. Pulmonary: Clear to auscultation bilaterally   NEUROLOGICAL EXAM:  MENTAL STATUS: Speech:    Speech is normal; fluent and spontaneous with normal comprehension.  Cognition:     Orientation to time, place and person     Normal recent and remote memory     Normal Attention span and concentration     Normal Language, naming, repeating,spontaneous speech     Fund of knowledge   CRANIAL NERVES: CN II: Visual fields are full to confrontation. Fundoscopic exam is normal with sharp discs and no vascular changes. Pupils are round equal and briskly reactive to light. CN III, IV, VI: extraocular movement are normal. No ptosis. CN V: Facial sensation is intact to pinprick in all 3 divisions bilaterally. Corneal responses are intact.  CN VII: Face is symmetric with normal eye closure and smile. CN VIII: Hearing is normal to rubbing fingers CN IX, X: Palate elevates symmetrically. Phonation is normal. CN XI: Head turning and shoulder shrug are intact CN XII: Tongue is midline with normal movements and no atrophy.  MOTOR: There is no pronator drift of out-stretched arms. Muscle bulk and tone are normal. Muscle strength is normal.  REFLEXES: Reflexes are 2+ and symmetric at the biceps, triceps, knees, and ankles. Plantar responses are flexor.  SENSORY: Intact to light touch, pinprick, positional sensation and vibratory sensation are intact in fingers and toes.  COORDINATION: Rapid alternating movements and fine finger movements are intact. There is no dysmetria on finger-to-nose and heel-knee-shin.    GAIT/STANCE: Posture is normal. Gait is steady with normal steps, base, arm swing, and turning. Heel and toe walking are normal. Tandem gait is normal.  Romberg is absent.   DIAGNOSTIC DATA (LABS, IMAGING, TESTING) - I reviewed patient records, labs, notes, testing and imaging myself where available.   ASSESSMENT AND  PLAN  Holly Serrano is a 40 y.o. female   Bilateral lower extremity paresthesia,  Need to rule out central nervous system structural lesions, proceed with MRI of brain, MRI of cervical spine  Cymbalta 30 mg, titrating to 60 mg daily    Marcial Pacas, M.D. Ph.D.  [facility] [address], [place],  [address] Ph: [phone] Fax: [phone]  CC: Aura Dials, MD

## 2016-02-01 DIAGNOSIS — R202 Paresthesia of skin: Secondary | ICD-10-CM | POA: Diagnosis not present

## 2016-02-02 ENCOUNTER — Ambulatory Visit (INDEPENDENT_AMBULATORY_CARE_PROVIDER_SITE_OTHER): Payer: Self-pay

## 2016-02-02 ENCOUNTER — Ambulatory Visit (INDEPENDENT_AMBULATORY_CARE_PROVIDER_SITE_OTHER): Payer: BLUE CROSS/BLUE SHIELD

## 2016-02-02 DIAGNOSIS — Z0289 Encounter for other administrative examinations: Secondary | ICD-10-CM

## 2016-02-02 DIAGNOSIS — R202 Paresthesia of skin: Secondary | ICD-10-CM

## 2016-02-02 DIAGNOSIS — M2550 Pain in unspecified joint: Secondary | ICD-10-CM

## 2016-02-23 ENCOUNTER — Encounter: Payer: Self-pay | Admitting: Neurology

## 2016-02-23 ENCOUNTER — Ambulatory Visit (INDEPENDENT_AMBULATORY_CARE_PROVIDER_SITE_OTHER): Payer: BLUE CROSS/BLUE SHIELD | Admitting: Neurology

## 2016-02-23 VITALS — BP 107/77 | HR 71 | Resp 16 | Ht 70.0 in | Wt 189.0 lb

## 2016-02-23 DIAGNOSIS — R202 Paresthesia of skin: Secondary | ICD-10-CM | POA: Diagnosis not present

## 2016-02-23 DIAGNOSIS — M2550 Pain in unspecified joint: Secondary | ICD-10-CM | POA: Diagnosis not present

## 2016-02-23 NOTE — Progress Notes (Signed)
Chief Complaint  Patient presents with  . Follow-up    Rm 4. F/U. Patient reports that since last visit she has had some weight gain and joint pain especially in the knees. Also here to discuss MRI.       PATIENT: Holly Serrano DOB: [birth year]  Chief Complaint  Patient presents with  . Follow-up    Rm 4. F/U. Patient reports that since last visit she has had some weight gain and joint pain especially in the knees. Also here to discuss MRI.      HISTORICAL  Holly Serrano is a 40 years old right-handed female, seen in refer by her primary care physician Dr. Aura Dials for evaluation of paresthesia In January 23 2016  I reviewed and summarized referring note, she had a history of anemia, is taking iron supplement,  She used to be very active, since January 2017, she began to have constellation of symptoms, feel fatigue, Shortness of breath with prolonged walking, her toes tends to change into blue color, she also complains of bilateral foot numbness, intermittent leg pain, numbness, foggy sensation, difficulty focusing, also has variable degree of body achy pain,  Symptoms getting worse since April 2017, she felt bloated in her abdomen, chest pressure, tightness sensation, intermittent neck, hip, joint pain, frequent headaches starting at occipital region, spreading forward, retro-orbital area pressure headaches, like to close her eyes. She also complains of constellation of complaints, including decreased energy, extreme fatigue, slurred speech, confusion,   I reviewed laboratory evaluation in December 13 2015 showed normal CBC, with hemoglobin of 12.9, normal CMP, B12 was 1 980, TSH was elevated 5.98, normal free T4, heavy metal profile was negative  Repeat laboratory showed elevated thyroglobulin antibody, 87.4, normal being less than 0.9, elevated TSH, 5.98, normal free T4, free T3, she will have evaluation by her endocrinologist soon.   Methylmalonic acid level was 200, heavy  metal profile was negative for lead, arsenic, mercury,  Thyroid uptake January 04 2016 showed normal 24-hour radioactive thyroid uptake and scan  UPDATE May 25th 2017: We have personally reviewed MRI of the brain in April 2017 that was normal, MRI of the cervical spine mild degenerative disc disease no significant foraminal canal stenosis  She continue to have intermittent muscle achiness, stiffness, bloating sensation, weight gain, she was recently diagnosed with low vitamin B12, has received intramuscular supplement, now on sublingual B12  REVIEW OF SYSTEMS: Full 14 system review of systems performed and notable only for weight gain, fatigue, frequent urination, achy muscles, joints pain, neck stiffness, headaches, murmur ALLERGIES: No Known Allergies  HOME MEDICATIONS: Current Outpatient Prescriptions  Medication Sig Dispense Refill  . DULoxetine (CYMBALTA) 30 MG capsule Take 1 capsule (30 mg total) by mouth daily. 30 capsule 11  . IRON PO Take by mouth daily.    Marland Kitchen MICROGESTIN 1-20 MG-MCG tablet   0   No current facility-administered medications for this visit.    PAST MEDICAL HISTORY: Past Medical History  Diagnosis Date  . Peripheral neuropathy ([place])     PAST SURGICAL HISTORY: Past Surgical History  Procedure Laterality Date  . Cesarean section      x 2    FAMILY HISTORY: Family History  Problem Relation Age of Onset  . Brain cancer Father   . Healthy Mother     SOCIAL HISTORY:  Social History   Social History  . Marital Status: Married    Spouse Name: N/A  . Number of Children: 2  . Years of Education: Masters  Occupational History  . Housewife    Social History Main Topics  . Smoking status: Never Smoker   . Smokeless tobacco: Never Used  . Alcohol Use: No  . Drug Use: No  . Sexual Activity: Yes   Other Topics Concern  . Not on file   Social History Narrative   Lives at home with husband and two children.   Right-handed.   2 cups tea per day.       PHYSICAL EXAM   Filed Vitals:   02/23/16 1216  BP: 107/77  Pulse: 71  Resp: 16  Height: 5' 10"  (1.778 m)  Weight: 189 lb (85.73 kg)    Not recorded      Body mass index is 27.12 kg/(m^2).  PHYSICAL EXAMNIATION:  Gen: NAD, conversant, well nourised, obese, well groomed                     Cardiovascular: Regular rate rhythm, no peripheral edema, warm, nontender. Eyes: Conjunctivae clear without exudates or hemorrhage Neck: Supple, no carotid bruise. Pulmonary: Clear to auscultation bilaterally   NEUROLOGICAL EXAM:  MENTAL STATUS: Speech:    Speech is normal; fluent and spontaneous with normal comprehension.  Cognition:     Orientation to time, place and person     Normal recent and remote memory     Normal Attention span and concentration     Normal Language, naming, repeating,spontaneous speech     Fund of knowledge   CRANIAL NERVES: CN II: Visual fields are full to confrontation. Fundoscopic exam is normal with sharp discs and no vascular changes. Pupils are round equal and briskly reactive to light. CN III, IV, VI: extraocular movement are normal. No ptosis. CN V: Facial sensation is intact to pinprick in all 3 divisions bilaterally. Corneal responses are intact.  CN VII: Face is symmetric with normal eye closure and smile. CN VIII: Hearing is normal to rubbing fingers CN IX, X: Palate elevates symmetrically. Phonation is normal. CN XI: Head turning and shoulder shrug are intact CN XII: Tongue is midline with normal movements and no atrophy.  MOTOR: There is no pronator drift of out-stretched arms. Muscle bulk and tone are normal. Muscle strength is normal.  REFLEXES: Reflexes are 2+ and symmetric at the biceps, triceps, knees, and ankles. Plantar responses are flexor.  SENSORY: Intact to light touch, pinprick, positional sensation and vibratory sensation are intact in fingers and toes.  COORDINATION: Rapid alternating movements and fine finger  movements are intact. There is no dysmetria on finger-to-nose and heel-knee-shin.    GAIT/STANCE: Posture is normal. Gait is steady with normal steps, base, arm swing, and turning. Heel and toe walking are normal. Tandem gait is normal.  Romberg is absent.   DIAGNOSTIC DATA (LABS, IMAGING, TESTING) - I reviewed patient records, labs, notes, testing and imaging myself where available.   ASSESSMENT AND PLAN  Holly Serrano is a 40 y.o. female   Bilateral lower extremity paresthesia,  MRI of the brain and cervical spine showed no significant abnormality,  She also complains of multiple joints pain, recently diagnosis of low vitamin B-12, anemia, there was a concern of possible celiac disease, she is going through endocrinologist, GI evaluations,  No neurological disease for her complaints, return to clinic for new issues    Marcial Pacas, M.D. Ph.D.  [facility] [address] [place], [place] [address] Ph: [phone] Fax: [phone]  CC: Aura Dials, MD

## 2016-12-06 ENCOUNTER — Other Ambulatory Visit: Payer: Self-pay | Admitting: Nurse Practitioner

## 2016-12-06 ENCOUNTER — Other Ambulatory Visit (HOSPITAL_COMMUNITY)
Admission: RE | Admit: 2016-12-06 | Discharge: 2016-12-06 | Disposition: A | Discharge status: Home / Self Care | Payer: BLUE CROSS/BLUE SHIELD | Source: Ambulatory Visit | Attending: Nurse Practitioner | Admitting: Nurse Practitioner

## 2016-12-06 DIAGNOSIS — Z01419 Encounter for gynecological examination (general) (routine) without abnormal findings: Secondary | ICD-10-CM | POA: Diagnosis present

## 2016-12-06 DIAGNOSIS — Z1151 Encounter for screening for human papillomavirus (HPV): Secondary | ICD-10-CM | POA: Diagnosis not present

## 2016-12-10 LAB — CYTOLOGY - PAP
Diagnosis: NEGATIVE
HPV (WINDOPATH): NOT DETECTED
